# Patient Record
Sex: Female | Born: 1970 | Race: Black or African American | Hispanic: No | Marital: Single | Smoking: Never smoker
Health system: Southern US, Community
[De-identification: ages and names within clinical notes are randomized; demographics above are authoritative.]

## PROBLEM LIST (undated history)

## (undated) DIAGNOSIS — M797 Fibromyalgia: Secondary | ICD-10-CM

## (undated) HISTORY — PX: APPENDECTOMY: SHX54

## (undated) HISTORY — PX: ABDOMINAL HYSTERECTOMY: SHX81

## (undated) HISTORY — PX: CHOLECYSTECTOMY: SHX55

---

## 2017-08-11 ENCOUNTER — Ambulatory Visit (HOSPITAL_COMMUNITY)
Admission: EM | Admit: 2017-08-11 | Discharge: 2017-08-11 | Disposition: A | Discharge status: Home / Self Care | Payer: BLUE CROSS/BLUE SHIELD | Attending: Family Medicine | Admitting: Family Medicine

## 2017-08-11 ENCOUNTER — Encounter (HOSPITAL_COMMUNITY): Payer: Self-pay | Admitting: Emergency Medicine

## 2017-08-11 DIAGNOSIS — M791 Myalgia, unspecified site: Secondary | ICD-10-CM

## 2017-08-11 HISTORY — DX: Fibromyalgia: M79.7

## 2017-08-11 MED ORDER — METHYLPREDNISOLONE ACETATE 80 MG/ML IJ SUSP
80.0000 mg | Freq: Once | INTRAMUSCULAR | Status: AC
  Administered 2017-08-11: 80 mg via INTRAMUSCULAR

## 2017-08-11 MED ORDER — METHYLPREDNISOLONE ACETATE 80 MG/ML IJ SUSP
INTRAMUSCULAR | Status: AC
  Filled 2017-08-11: qty 1

## 2017-08-11 NOTE — Discharge Instructions (Signed)
I can see why your doctors initially diagnosed fibromyalgia: Your rheumatoid tests were almost all negative. Nevertheless she did have one that was elevated, and I think it's time to look again at possible rheumatologic diseases. The fact that you have not benefited from the Cymbalta and Celebrex make me suspicious that fibromyalgia may not be the correct diagnosis.  I would like you to find a doctor to begin the process of reexamination of your symptoms and looking for more effective treatments.

## 2017-08-11 NOTE — ED Triage Notes (Signed)
[facility] a complaint of general body aches related to the Fibromyalgia.

## 2017-08-11 NOTE — ED Provider Notes (Signed)
[facility]    CSN: [id number] Arrival date & time: 08/11/17  1943     History   Chief Complaint Chief Complaint  Patient presents with  . Generalized Body Aches    HPI Tina SchoonerYvonne Deloris Ewing CheHubbard is a 46 y.o. female.   [facility] a [facility] of general body aches related to the Fibromyalgia.  Patient was originally diagnosed with fibromyalgia after beginning to have diffuse shoulder soreness about 6 months ago. Patient entire rheumatoid panel was performed and the only abnormality seen was the CCP. Rheumatoid factor, angiotensin converting enzyme, sedimentation rate, C-reactive protein were all negative. She was started on Cymbalta and Celebrex which have not controlled her symptoms.  Over the last 48 hours, patient is developed diffuse muscle soreness and tenderness. She's also noticed some swelling in her fingers symmetrically.  Patient denies cough, fever, diarrhea. She did have some nausea with the Cymbalta/Celebrex combination initially. She's had no rash, sore throat, or eye irritation.      Past Medical History:  Diagnosis Date  . Fibromyalgia     There are no active problems to display for this patient.   Past Surgical History:  Procedure Laterality Date  . ABDOMINAL HYSTERECTOMY    . APPENDECTOMY    . CHOLECYSTECTOMY      OB History    No data available       Home Medications    Prior to Admission medications   Medication Sig Start Date End Date Taking? Authorizing Provider  celecoxib (CELEBREX) 400 MG capsule Take 400 mg by mouth daily after breakfast.   Yes [provider]  DULoxetine (CYMBALTA) 30 MG capsule Take 60 mg by mouth daily.   Yes [provider]  ondansetron (ZOFRAN) 4 MG tablet Take 4 mg by mouth every 8 (eight) hours as needed for nausea or vomiting.   Yes [provider]  Vitamin D, Ergocalciferol, (DRISDOL) 50000 units CAPS capsule Take 50,000 Units by mouth every 7  (seven) days.   Yes [provider]    Family History History reviewed. No pertinent family history.  Social History Social History  Substance Use Topics  . Smoking status: Never Smoker  . Smokeless tobacco: Never Used  . Alcohol use Yes     Comment: Occ     Allergies   Patient has no known allergies.   Review of Systems Review of Systems  Musculoskeletal: Positive for joint swelling and myalgias.  All other systems reviewed and are negative.    Physical Exam Triage Vital Signs ED Triage Vitals [08/11/17 2002]  Enc Vitals Group     BP      Pulse      Resp      Temp      Temp src      SpO2      Weight      Height      Head Circumference      Peak Flow      Pain Score 8     Pain Loc      Pain Edu?      Excl. in GC?    No data found.   Updated Vital Signs BP (!) 179/107 (BP Location: Right Arm)   Pulse 86   Temp 98.2 F (36.8 C) (Oral)   Resp 20   SpO2 100%    Physical Exam  Constitutional: She is oriented to person, place, and time. She appears well-developed and well-nourished.  HENT:  Right  Ear: External ear normal.  Left Ear: External ear normal.  Mouth/Throat: Oropharynx is clear and moist.  Eyes: Pupils are equal, round, and reactive to light. Conjunctivae and EOM are normal.  Neck: Normal range of motion. Neck supple.  Cardiovascular: Normal rate, regular rhythm and normal heart sounds.   Pulmonary/Chest: Effort normal and breath sounds normal.  Musculoskeletal: Normal range of motion. She exhibits tenderness.  Diffuse tenderness when palpating large muscle groups, no clear joint swelling on fingers or hands or elbows or wrists  Neurological: She is alert and oriented to person, place, and time.  Skin: Skin is warm and dry.  Psychiatric: She has a normal mood and affect.  Nursing note and vitals reviewed.    UC Treatments / Results  Labs (all labs ordered are listed, but only abnormal results are displayed) Labs Reviewed -  No data to display  EKG  EKG Interpretation None       Radiology No results found.  Procedures Procedures (including critical care time)  Medications Ordered in UC Medications  methylPREDNISolone acetate (DEPO-MEDROL) injection 80 mg (not administered)     Initial Impression / Assessment and Plan / UC Course  I have reviewed the triage vital signs and the nursing notes.  Pertinent labs & imaging results that were available during my care of the patient were reviewed by me and considered in my medical decision making (see chart for details).     Final Clinical Impressions(s) / UC Diagnoses   Final diagnoses:  Myalgia    New Prescriptions New Prescriptions   No medications on file   Depomedrol 80 mg IM tonight.  Controlled Substance Prescriptions [place] Controlled Substance Registry consulted? Not Applicable   Elvina Sidle, MD 08/11/17 2025

## 2017-09-11 ENCOUNTER — Encounter (HOSPITAL_COMMUNITY): Payer: Self-pay

## 2017-09-11 ENCOUNTER — Encounter: Payer: Self-pay | Admitting: Orthopaedic Surgery

## 2017-09-11 ENCOUNTER — Emergency Department (HOSPITAL_COMMUNITY)
Admission: EM | Admit: 2017-09-11 | Discharge: 2017-09-11 | Disposition: A | Discharge status: Home / Self Care | Payer: BLUE CROSS/BLUE SHIELD | Attending: Emergency Medicine | Admitting: Emergency Medicine

## 2017-09-11 ENCOUNTER — Emergency Department (HOSPITAL_COMMUNITY): Payer: BLUE CROSS/BLUE SHIELD

## 2017-09-11 DIAGNOSIS — R52 Pain, unspecified: Secondary | ICD-10-CM

## 2017-09-11 DIAGNOSIS — M79605 Pain in left leg: Secondary | ICD-10-CM | POA: Insufficient documentation

## 2017-09-11 DIAGNOSIS — S86012A Strain of left Achilles tendon, initial encounter: Secondary | ICD-10-CM | POA: Diagnosis not present

## 2017-09-11 MED ORDER — ACETAMINOPHEN 500 MG PO TABS
1000.0000 mg | ORAL_TABLET | Freq: Once | ORAL | Status: AC
  Administered 2017-09-11: 1000 mg via ORAL
  Filled 2017-09-11: qty 2

## 2017-09-11 MED ORDER — OXYCODONE HCL 5 MG PO TABS: 2.5000 mg | ORAL_TABLET | ORAL | 0 refills | Status: AC | PRN

## 2017-09-11 NOTE — ED Triage Notes (Signed)
Per Pt, Pt is coming from home with complaints of hearing a "pop" sensation while walking today and felt shooting pain in the left heel. pt denies any injury or fall.

## 2017-09-11 NOTE — Discharge Instructions (Signed)
Keep splint intact and dry.  Do not put weight down on left leg.  Ice to area 30 minutes 4 times a day.

## 2017-09-11 NOTE — Progress Notes (Signed)
Orthopedic Tech Progress Note Patient Details:  Janie MorningYvonne Deloris Halterman 08/03/1971 [id number]  Ortho Devices Type of Ortho Device: Ace wrap, Post (short leg) splint Ortho Device/Splint Interventions: Application   Saul FordyceJennifer C Jocilynn Grade 09/11/2017, 2:51 PM

## 2017-09-11 NOTE — Evaluation (Signed)
Physical Therapy Evaluation Patient Details Name: Tina Ewing MRN: [id number] DOB: [birth year] Today's Date: 09/11/2017   History of Present Illness  Pt is a 46 y/o female presenting to ED after hearing a "pop" in her LLE and being unable to ambulation. Imaging concerning for L achilles tendon rupture. PMH includes fibromyalgia.   Clinical Impression  Pt presenting to ED with problem above and deficits below. PTA, pt was independent with functional mobility. Upon eval, pt requiring supervision for safety for gait with use of RW. Administered handout and educated about stair management using RW and assist level required. Also educated about alternative seated method if unable to use RW. Pt reported she felt comfortable with all education and was ready to go home. Educated about possible need for tub bench, however, pt refusing at this time. All education completed. Will sign off. If needs change, please re-consult.     Follow Up Recommendations No PT follow up;Supervision for mobility/OOB    Equipment Recommendations  None recommended by PT (Refused to get tub bench and refused 3 in 1 )    Recommendations for Other Services       Precautions / Restrictions Precautions Precautions: None Required Braces or Orthoses: Other Brace/Splint Other Brace/Splint: Short leg splint  Restrictions Weight Bearing Restrictions: Yes LLE Weight Bearing: Non weight bearing      Mobility  Bed Mobility               General bed mobility comments: In chair upon entry   Transfers Overall transfer level: Needs assistance Equipment used: Rolling walker (2 wheeled) Transfers: Sit to/from Stand Sit to Stand: Supervision         General transfer comment: Supervision for safety.   Ambulation/Gait Ambulation/Gait assistance: Supervision Ambulation Distance (Feet): 25 Feet Assistive device: Rolling walker (2 wheeled) Gait Pattern/deviations: Step-to pattern Gait velocity:  Decreased Gait velocity interpretation: Below normal speed for age/gender General Gait Details: Slow, overall steady gait. Pt only wanting to perform short ambulation distance within room, as pt reporting she is more concerned with how she is going to navigate steps.   Stairs Stairs: Yes       General stair comments: Reviewed and administered handout regarding how to navigate stairs while using RW. Educated to have brother help her with stair navigation upon return home. Educated that if pt did not feel comfortable with using RW, could use seated technique and use UE to assist with navigating stairs. Pt reports she felt comfortable.   Wheelchair Mobility    Modified Rankin (Stroke Patients Only)       Balance Overall balance assessment: Needs assistance Sitting-balance support: No upper extremity supported;Feet supported Sitting balance-Leahy Scale: Good     Standing balance support: Bilateral upper extremity supported;During functional activity Standing balance-Leahy Scale: Poor Standing balance comment: Reliant on UE support for balance.                              Pertinent Vitals/Pain Pain Assessment: Faces Faces Pain Scale: Hurts little more Pain Location: LLE  Pain Descriptors / Indicators: Aching Pain Intervention(s): Limited activity within patient's tolerance;Monitored during session;Repositioned    Home Living Family/patient expects to be discharged to:: Private residence Living Arrangements: Spouse/significant other Available Help at Discharge: Family;Available PRN/intermittently Type of Home: House Home Access: Stairs to enter Entrance Stairs-Rails: None Entrance Stairs-Number of Steps: 6 Home Layout: One level Home Equipment: Other (comment) (delivered bari RW)  Prior Function Level of Independence: Independent               Hand Dominance        Extremity/Trunk Assessment   Upper Extremity Assessment Upper Extremity  Assessment: Overall WFL for tasks assessed    Lower Extremity Assessment Lower Extremity Assessment: LLE deficits/detail LLE Deficits / Details: In short leg splint; Knee and hip strength WNL    Cervical / Trunk Assessment Cervical / Trunk Assessment: Normal  Communication   Communication: No difficulties  Cognition Arousal/Alertness: Awake/alert Behavior During Therapy: WFL for tasks assessed/performed Overall Cognitive Status: Within Functional Limits for tasks assessed                                        General Comments General comments (skin integrity, edema, etc.): Reviewed education with pt, and pt reported she felt comfortable with going home. Educated about possible need for tub bench for safety during bathing, however, pt refusing. Reviewed where she could purchase if she changed her mind.     Exercises     Assessment/Plan    PT Assessment Patent does not need any further PT services  PT Problem List         PT Treatment Interventions      PT Goals (Current goals can be found in the Care Plan section)  Acute Rehab PT Goals Patient Stated Goal: to go home  PT Goal Formulation: With patient Time For Goal Achievement: 09/11/17 Potential to Achieve Goals: Good    Frequency     Barriers to discharge        Co-evaluation               AM-PAC PT "6 Clicks" Daily Activity  Outcome Measure Difficulty turning over in bed (including adjusting bedclothes, sheets and blankets)?: None Difficulty moving from lying on back to sitting on the side of the bed? : None Difficulty sitting down on and standing up from a chair with arms (e.g., wheelchair, bedside commode, etc,.)?: None Help needed moving to and from a bed to chair (including a wheelchair)?: None Help needed walking in hospital room?: None Help needed climbing 3-5 steps with a railing? : A Little 6 Click Score: 23    End of Session   Activity Tolerance: Patient tolerated treatment  well Patient left: in chair;with call bell/phone within reach;with family/visitor present Nurse Communication: Mobility status PT Visit Diagnosis: Other abnormalities of gait and mobility (R26.89)    Time: [id number] PT Time Calculation (min) (ACUTE ONLY): 14 min   Charges:   PT Evaluation $PT Eval Low Complexity: 1 Low     PT G Codes:   PT G-Codes **NOT FOR INPATIENT CLASS** Functional Assessment Tool Used: AM-PAC 6 Clicks Basic Mobility Functional Limitation: Mobility: Walking and moving around Mobility: Walking and Moving Around Current Status ([id number](G8978): At least 1 percent but less than 20 percent impaired, limited or restricted Mobility: Walking and Moving Around Goal Status [phone](G8979): At least 1 percent but less than 20 percent impaired, limited or restricted Mobility: Walking and Moving Around Discharge Status [phone](G8980): At least 1 percent but less than 20 percent impaired, limited or restricted    Gladys DammeBrittany Kassem Kibbe, PT, DPT  Acute Rehabilitation Services  Pager: [phone]   Lehman PromBrittany S Ayssa Bentivegna 09/11/2017, 4:56 PM

## 2017-09-11 NOTE — ED Notes (Addendum)
Physical therapy at bedside

## 2017-09-11 NOTE — Discharge Planning (Signed)
Oletta Cohnamellia Dandrea Widdowson, RN, BSN, [place] [phone] Pt qualifies for DME rolling walker.  DME  ordered through [facility].  [facility] to [facility] rolling walker to pt room prior to D/C home.

## 2017-09-11 NOTE — Consult Note (Signed)
Reason for Consult:[name] is an 46 y.o. female.  HPI: Tina Ewing was walking with morning about 1030 when she felt like something might have hit her heel and then she heard a pop. She stopped but did not fall. When she didn't see anything she started to walk again but was unable to ambulate. She denies prior hx, recent abx use, or N/T.  Past Medical History:  Diagnosis Date  . Fibromyalgia     Past Surgical History:  Procedure Laterality Date  . ABDOMINAL HYSTERECTOMY    . APPENDECTOMY    . CHOLECYSTECTOMY      No family history on file.  Social History:  reports that she has never smoked. She has never used smokeless tobacco. She reports that she drinks alcohol. She reports that she does not use drugs.  Allergies: No Known Allergies  Medications: I have reviewed the patient's current medications.  No results found for this or any previous visit (from the past 48 hour(s)).  Dg Foot Complete Left  Result Date: 09/11/2017 CLINICAL DATA:  Tina Ewing a pop in her LEFT foot while walking in the hall, pain posterior to the heel of LEFT foot EXAM: LEFT FOOT - COMPLETE 3+ VIEW COMPARISON:  None FINDINGS: Osseous mineralization normal. Joint spaces preserved. Osseous densities identified posteriorly at the distal lower leg likely related to Achilles tendinopathy. Attenuation of the Achilles silhouette with irregularity at the Achilles insertion and prominent soft tissue at the posterior lower leg, constellation of findings suspicious for Achilles tendon rupture. Plantar and Achilles insertion calcaneal spur formation. No acute fracture, dislocation, or bone destruction. IMPRESSION: No acute osseous abnormalities. Suspicion of Achilles tendinopathy and Achilles tendon rupture; further evaluation by MR imaging recommended. Electronically Signed   By: Ulyses Southward M.D.   On: 09/11/2017 12:40    Review of Systems  Constitutional:  Negative for weight loss.  HENT: Negative for ear discharge, ear pain, hearing loss and tinnitus.   Eyes: Negative for blurred vision, double vision, photophobia and pain.  Respiratory: Negative for cough, sputum production and shortness of breath.   Cardiovascular: Negative for chest pain.  Gastrointestinal: Negative for abdominal pain, nausea and vomiting.  Genitourinary: Negative for dysuria, flank pain, frequency and urgency.  Musculoskeletal: Positive for joint pain (Left heel). Negative for back pain, falls, myalgias and neck pain.  Neurological: Negative for dizziness, tingling, sensory change, focal weakness, loss of consciousness and headaches.  Endo/Heme/Allergies: Does not bruise/bleed easily.  Psychiatric/Behavioral: Negative for depression, memory loss and substance abuse. The patient is not nervous/anxious.    Blood pressure (!) 141/100, pulse 92, temperature 98.1 F (36.7 C), temperature source Oral, resp. rate 16, height  (1.575 m), weight 131.5 kg (290 lb), SpO2 97 %. Physical Exam  Constitutional: She appears well-developed and well-nourished. No distress.  HENT:  Head: Normocephalic.  Eyes: Conjunctivae are normal. Right eye exhibits no discharge. Left eye exhibits no discharge. No scleral icterus.  Cardiovascular: Normal rate and regular rhythm.   Respiratory: Effort normal. No respiratory distress.  Musculoskeletal:  LLE No traumatic wounds, ecchymosis, or rash  TTP heel/calf, defect noted, Tina Ewing test positive  No knee or ankle effusion  Knee stable to varus/ valgus and anterior/posterior stress  Sens DPN, SPN, TN intact  Motor EHL, ext, evers 5/5, flex 1/5  DP 1+, PT 0, No significant edema   Neurological: She is alert.  Skin: Skin is warm and dry. She is not diaphoretic.  Psychiatric: She has  a normal mood and affect. Her behavior is normal.    Assessment/Plan: Left Achille's tendon rupture with calc avulsion -- Appears to be complete. Will splint pt  in resting eq uinus and have her f/u with Dr. Roda ShuttersXu later this week. NWB on crutches until then. Ice and NSAID's would be appropriate.    Freeman CaldronMichael J. Meghann Landing, PA-C Orthopedic Surgery [phone] 09/11/2017, 1:39 PM

## 2017-09-11 NOTE — ED Notes (Signed)
Patient transported to X-ray 

## 2017-09-11 NOTE — ED Provider Notes (Signed)
[facility]   CSN: [id number] Arrival date & time: 09/11/17  1048     History   Chief Complaint Chief Complaint  Patient presents with  . Foot Pain    HPI Tina SchoonerYvonne Deloris Williams CheHubbard is a 46 y.o. female who presents emergency by with chief complaint of left heel and leg pain. Patient states that she was walking down the street today when she heard and felt a sudden pop in her left heel she had immediate pain and pain radiating up the back of her leg. She was unable to ambulate or move the ankle. She denies any previous injuries to this ankle. She has taken no recent antibiotics.  HPI  Past Medical History:  Diagnosis Date  . Fibromyalgia     There are no active problems to display for this patient.   Past Surgical History:  Procedure Laterality Date  . ABDOMINAL HYSTERECTOMY    . APPENDECTOMY    . CHOLECYSTECTOMY      OB History    No data available       Home Medications    Prior to Admission medications   Medication Sig Start Date End Date Taking? Authorizing Provider  celecoxib (CELEBREX) 400 MG capsule Take 400 mg by mouth daily after breakfast.    [provider]  DULoxetine (CYMBALTA) 30 MG capsule Take 60 mg by mouth daily.    [provider]  ondansetron (ZOFRAN) 4 MG tablet Take 4 mg by mouth every 8 (eight) hours as needed for nausea or vomiting.    [provider]  Vitamin D, Ergocalciferol, (DRISDOL) 50000 units CAPS capsule Take 50,000 Units by mouth every 7 (seven) days.    [provider]    Family History No family history on file.  Social History Social History  Substance Use Topics  . Smoking status: Never Smoker  . Smokeless tobacco: Never Used  . Alcohol use Yes     Comment: Occ     Allergies   Patient has no known allergies.   Review of Systems Review of Systems Ten systems reviewed and are negative for acute change, except as noted in the HPI.    Physical Exam Updated  Vital Signs BP (!) 141/100 (BP Location: Right Arm)   Pulse 92   Temp 98.1 F (36.7 C) (Oral)   Resp 16   Ht 5\' 2"  (1.575 m)   Wt 131.5 kg (290 lb)   SpO2 97%   BMI 53.04 kg/m   Physical Exam  Constitutional: She is oriented to person, place, and time. She appears well-developed and well-nourished. No distress.  HENT:  Head: Normocephalic and atraumatic.  Eyes: Conjunctivae are normal. No scleral icterus.  Neck: Normal range of motion.  Cardiovascular: Normal rate, regular rhythm and normal heart sounds.  Exam reveals no gallop and no friction rub.   No murmur heard. Pulmonary/Chest: Effort normal and breath sounds normal. No respiratory distress.  Abdominal: Soft. Bowel sounds are normal. She exhibits no distension and no mass. There is no tenderness. There is no guarding.  Musculoskeletal:  Exquisitely tender to palpation of the left heel. Janee Mornhompson test is equivocal secondary to body habitus. Able to wiggle toes. She is able to dorsiflex the ankle but has severe pain posteriorly.  Neurological: She is alert and oriented to person, place, and time.  Skin: Skin is warm and dry. She is not diaphoretic.  Psychiatric: Her behavior is normal.  Nursing note and vitals reviewed.    ED Treatments /  Results  Labs (all labs ordered are listed, but only abnormal results are displayed) Labs Reviewed - No data to display  EKG  EKG Interpretation None       Radiology Dg Foot Complete Left  Result Date: 09/11/2017 CLINICAL DATA:  Marthe Patch a pop in her LEFT foot while walking in the hall, pain posterior to the heel of LEFT foot EXAM: LEFT FOOT - COMPLETE 3+ VIEW COMPARISON:  None FINDINGS: Osseous mineralization normal. Joint spaces preserved. Osseous densities identified posteriorly at the distal lower leg likely related to Achilles tendinopathy. Attenuation of the Achilles silhouette with irregularity at the Achilles insertion and prominent soft tissue at the posterior lower leg,  constellation of findings suspicious for Achilles tendon rupture. Plantar and Achilles insertion calcaneal spur formation. No acute fracture, dislocation, or bone destruction. IMPRESSION: No acute osseous abnormalities. Suspicion of Achilles tendinopathy and Achilles tendon rupture; further evaluation by MR imaging recommended. Electronically Signed   By: Ulyses Southward M.D.   On: 09/11/2017 12:40    Procedures Procedures (including critical care time)  Medications Ordered in ED Medications - No data to display   Initial Impression / Assessment and Plan / ED Course  I have reviewed the triage vital signs and the nursing notes.  Pertinent labs & imaging results that were available during my care of the patient were reviewed by me and considered in my medical decision making (see chart for details).     Skin with acute spontaneous rupture of the Achilles tendon with some avulsion of the calcaneus. Patient unable to use crutches secondary to her fibromyalgia and feet. Patient will be discharged with a walker. She was extremely unhappy with either of these choices and really wanted a knee scooter however it is not something that we are unable to obtain. I offered the patient 3 choices including crutches, walker, or skilled nursing facility placement. She states that no matter where I had none of our ambulatory aids to be helpful. I repeatedly stressed to the patient that I was trying to help her but I was limited by what was available to give her and that she would still not be able to bear weight on the splint. The patient states that she understands. I informed physical therapy to have the patient taught how to properly use the walker to go up and down stairs. She was able to do this and appears appropriate for discharge at this time. She will follow up with Dr.Xu. She was seen by Charma Igo PA in the emergency department  Final Clinical Impressions(s) / ED Diagnoses   Final diagnoses:  Pain     New Prescriptions New Prescriptions   No medications on file     Arthor Captain, Cordelia Poche 09/12/17 1610    Gwyneth Sprout, MD 09/12/17 2127

## 2017-09-11 NOTE — ED Notes (Signed)
Ortho tech notified.  

## 2017-09-11 NOTE — ED Notes (Signed)
Pt c/o sudden, sharp left heel pain this am while walking. Pain now radiates up left leg.

## 2017-09-13 ENCOUNTER — Other Ambulatory Visit: Payer: Self-pay | Admitting: Foot and Ankle Surgery

## 2017-09-13 DIAGNOSIS — S86012A Strain of left Achilles tendon, initial encounter: Secondary | ICD-10-CM

## 2017-09-15 ENCOUNTER — Other Ambulatory Visit: Payer: BLUE CROSS/BLUE SHIELD

## 2017-09-16 ENCOUNTER — Ambulatory Visit
Admission: RE | Admit: 2017-09-16 | Discharge: 2017-09-16 | Disposition: A | Discharge status: Home / Self Care | Payer: BLUE CROSS/BLUE SHIELD | Source: Ambulatory Visit | Attending: Foot and Ankle Surgery | Admitting: Foot and Ankle Surgery

## 2017-09-16 DIAGNOSIS — S86012A Strain of left Achilles tendon, initial encounter: Secondary | ICD-10-CM

## 2019-09-12 IMAGING — DX DG FOOT COMPLETE 3+V*L*
3 series · 3 of 3 positions shown · non-contrast
Comparison: None

CLINICAL DATA: Heard a pop in her LEFT foot while walking in the
hall, pain posterior to the heel of LEFT foot

EXAM:
LEFT FOOT - COMPLETE 3+ VIEW

[x foot ap left]
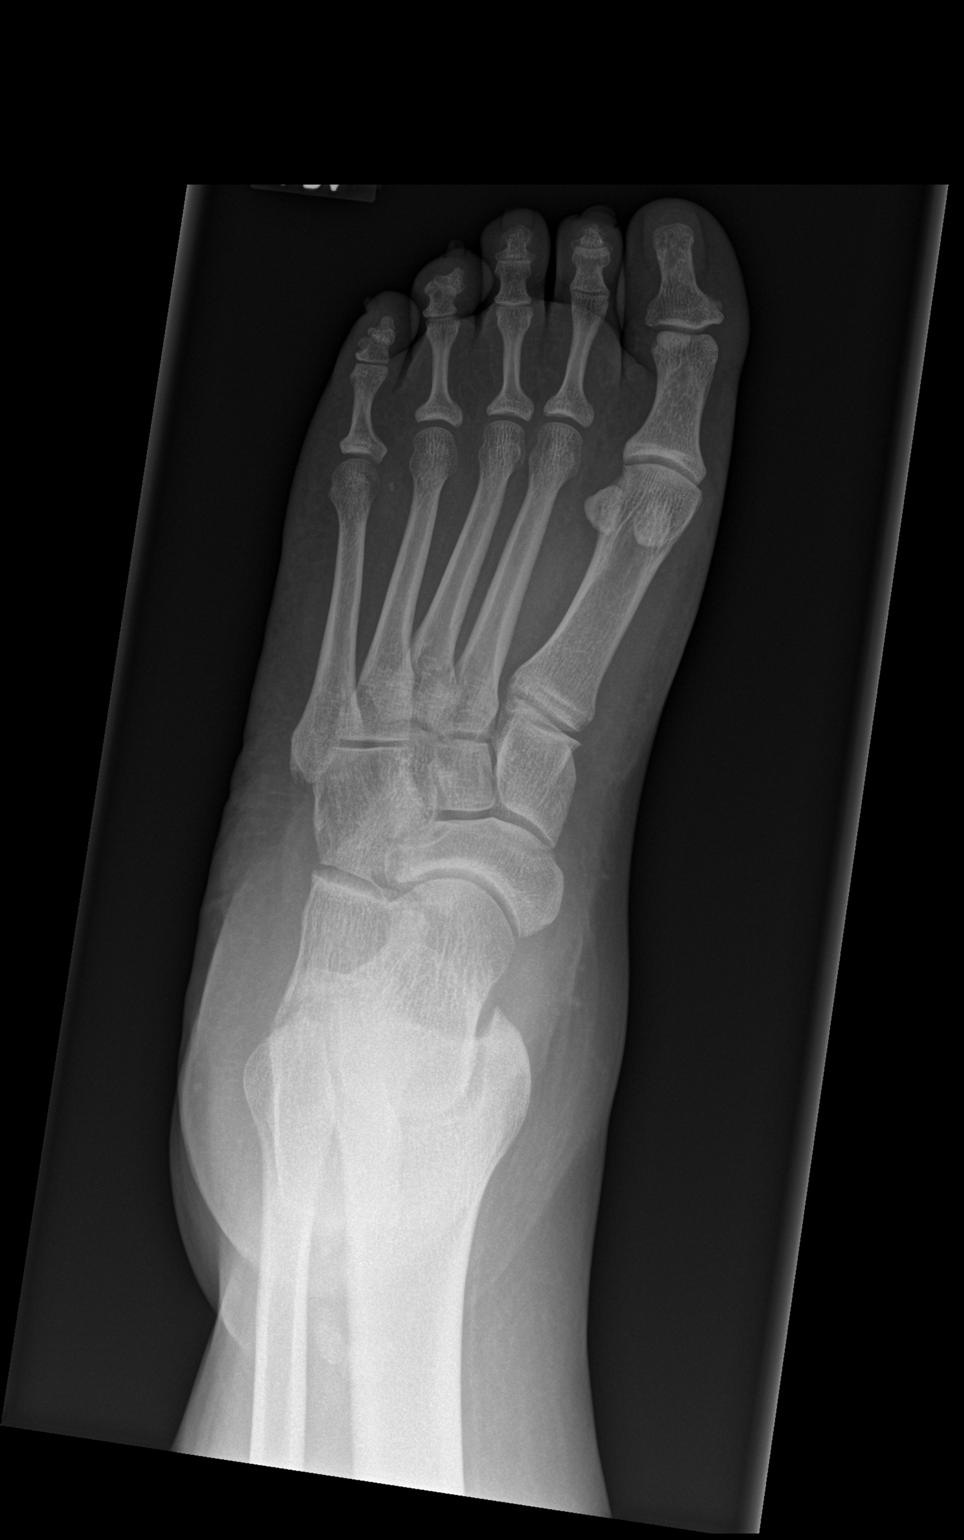

[x foot obl left]
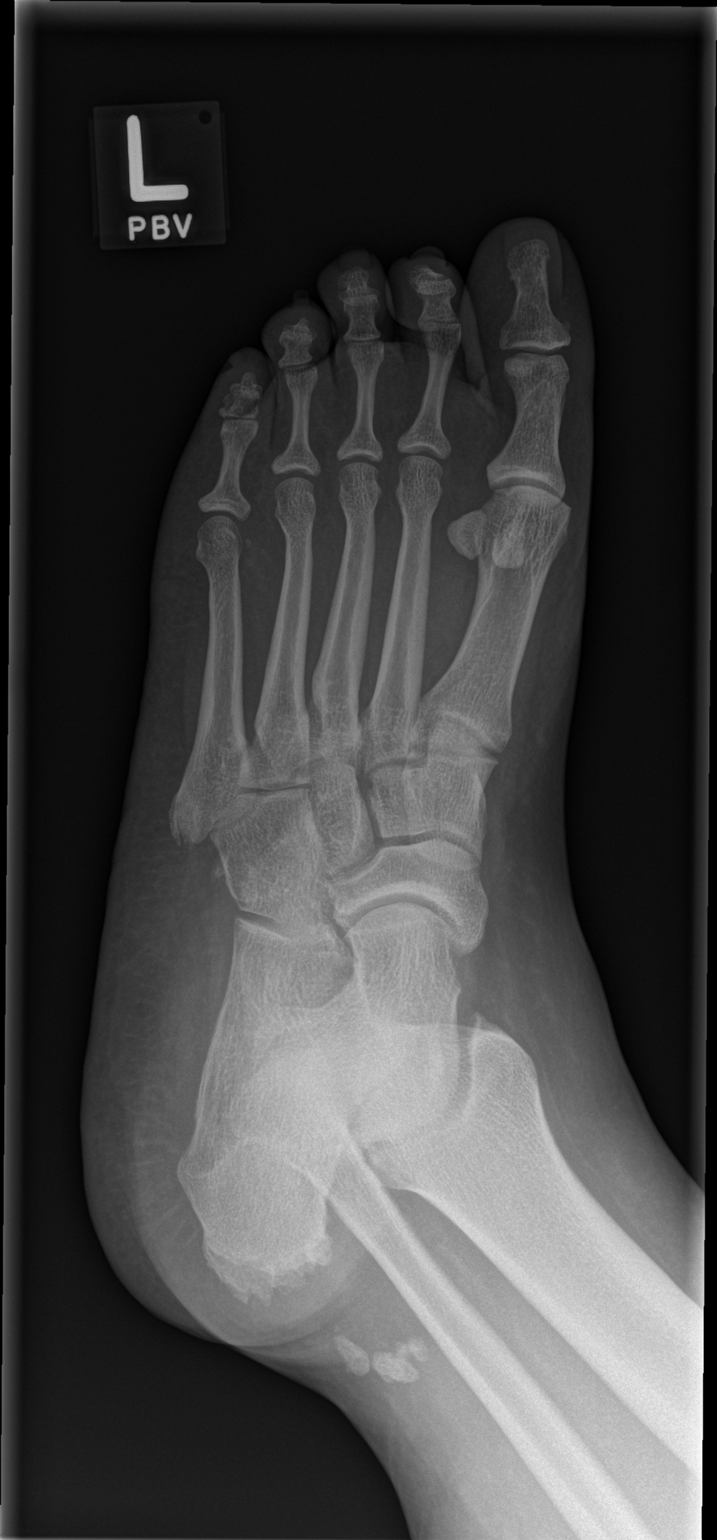

[x foot lat left]
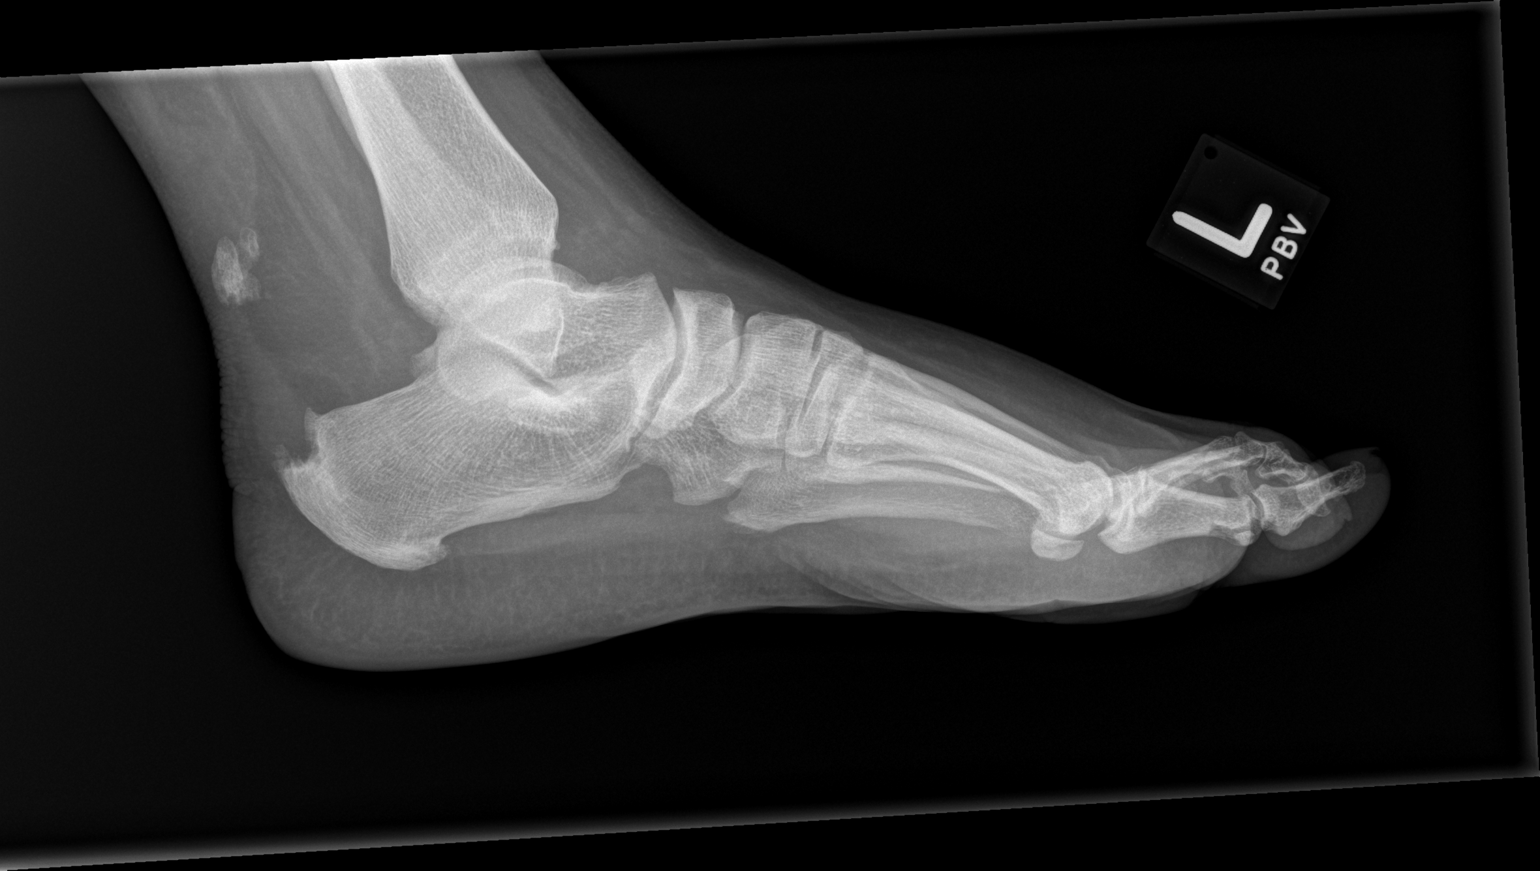

[3 of 3 positions shown; findings below may reference images not displayed]

FINDINGS: Osseous mineralization normal.

Joint spaces preserved.

Osseous densities identified posteriorly at the distal lower leg
likely related to Achilles tendinopathy.

Attenuation of the Achilles silhouette with irregularity at the
Achilles insertion and prominent soft tissue at the posterior lower
leg, constellation of findings suspicious for Achilles tendon
rupture.

Plantar and Achilles insertion calcaneal spur formation.

No acute fracture, dislocation, or bone destruction.
IMPRESSION: No acute osseous abnormalities.

Suspicion of Achilles tendinopathy and Achilles tendon rupture;
further evaluation by MR imaging recommended.

## 2025-01-30 ENCOUNTER — Emergency Department (HOSPITAL_COMMUNITY)

## 2025-01-30 ENCOUNTER — Emergency Department (HOSPITAL_COMMUNITY)
Admission: EM | Admit: 2025-01-30 | Discharge: 2025-01-30 | Disposition: A | Discharge status: Home / Self Care | Attending: Emergency Medicine | Admitting: Emergency Medicine

## 2025-01-30 ENCOUNTER — Other Ambulatory Visit: Payer: Self-pay

## 2025-01-30 DIAGNOSIS — Y92410 Unspecified street and highway as the place of occurrence of the external cause: Secondary | ICD-10-CM | POA: Diagnosis not present

## 2025-01-30 DIAGNOSIS — S161XXA Strain of muscle, fascia and tendon at neck level, initial encounter: Secondary | ICD-10-CM | POA: Diagnosis not present

## 2025-01-30 DIAGNOSIS — S20211A Contusion of right front wall of thorax, initial encounter: Secondary | ICD-10-CM | POA: Insufficient documentation

## 2025-01-30 DIAGNOSIS — S199XXA Unspecified injury of neck, initial encounter: Secondary | ICD-10-CM | POA: Diagnosis present

## 2025-01-30 MED ORDER — HYDROCODONE-ACETAMINOPHEN 5-325 MG PO TABS: 1.0000 | ORAL_TABLET | Freq: Four times a day (QID) | ORAL | 0 refills | Status: AC | PRN
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
## 2025-01-30 NOTE — ED Notes (Signed)
 Patient transported to x-ray. ?

## 2025-01-30 NOTE — Discharge Instructions (Signed)
 Return if any problems.  Follow up with your Physician for recheck.
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
